# Patient Record
Sex: Female | Born: 1975 | Race: Black or African American | Hispanic: No | Marital: Single | Smoking: Current every day smoker
Health system: Southern US, Community
[De-identification: ages and names within clinical notes are randomized; demographics above are authoritative.]

---

## 2000-10-20 ENCOUNTER — Other Ambulatory Visit: Admission: RE | Admit: 2000-10-20 | Discharge: 2000-10-20 | Payer: Self-pay | Admitting: Obstetrics and Gynecology

## 2000-10-23 ENCOUNTER — Emergency Department (HOSPITAL_COMMUNITY): Admission: EM | Admit: 2000-10-23 | Discharge: 2000-10-23 | Payer: Self-pay | Admitting: Emergency Medicine

## 2002-09-01 ENCOUNTER — Emergency Department (HOSPITAL_COMMUNITY): Admission: EM | Admit: 2002-09-01 | Discharge: 2002-09-01 | Payer: Self-pay

## 2003-01-01 ENCOUNTER — Emergency Department (HOSPITAL_COMMUNITY): Admission: EM | Admit: 2003-01-01 | Discharge: 2003-01-01 | Payer: Self-pay | Admitting: Internal Medicine

## 2004-04-11 ENCOUNTER — Observation Stay (HOSPITAL_COMMUNITY): Admission: EM | Admit: 2004-04-11 | Discharge: 2004-04-12 | Payer: Self-pay | Admitting: Emergency Medicine

## 2005-11-28 ENCOUNTER — Emergency Department: Payer: Self-pay | Admitting: Emergency Medicine

## 2009-01-01 ENCOUNTER — Emergency Department (HOSPITAL_COMMUNITY): Admission: EM | Admit: 2009-01-01 | Discharge: 2009-01-01 | Payer: Self-pay | Admitting: Emergency Medicine

## 2009-01-03 ENCOUNTER — Emergency Department (HOSPITAL_COMMUNITY): Admission: EM | Admit: 2009-01-03 | Discharge: 2009-01-03 | Payer: Self-pay | Admitting: Emergency Medicine

## 2010-04-27 LAB — DIFFERENTIAL
Basophils Absolute: 0 10*3/uL (ref 0.0–0.1)
Basophils Relative: 0 % (ref 0–1)
Eosinophils Relative: 1 % (ref 0–5)
Monocytes Absolute: 0.8 10*3/uL (ref 0.1–1.0)
Monocytes Relative: 8 % (ref 3–12)
Neutro Abs: 6.9 10*3/uL (ref 1.7–7.7)
Neutrophils Relative %: 70 % (ref 43–77)

## 2010-04-27 LAB — URINALYSIS, ROUTINE W REFLEX MICROSCOPIC
Bilirubin Urine: NEGATIVE
Leukocytes, UA: NEGATIVE
Nitrite: NEGATIVE
Specific Gravity, Urine: 1.025 (ref 1.005–1.030)
pH: 6 (ref 5.0–8.0)

## 2010-04-27 LAB — CBC
HCT: 45.8 % (ref 36.0–46.0)
Hemoglobin: 15.1 g/dL — ABNORMAL HIGH (ref 12.0–15.0)
MCHC: 32.9 g/dL (ref 30.0–36.0)
RBC: 4.83 MIL/uL (ref 3.87–5.11)

## 2010-04-27 LAB — BASIC METABOLIC PANEL
Chloride: 102 mEq/L (ref 96–112)
GFR calc Af Amer: >60 mL/min (ref >60)
GFR calc non Af Amer: >60 mL/min (ref >60)
Potassium: 3.6 mEq/L (ref 3.5–5.1)
Sodium: 137 mEq/L (ref 135–145)

## 2010-04-27 LAB — URINE MICROSCOPIC-ADD ON

## 2010-06-11 NOTE — Op Note (Signed)
NAME:  Kara May, EBANKS NO.:  [id number]   MEDICAL RECORD NO.:  [id number]          PATIENT TYPE:  INP   LOCATION:  A310                          FACILITY:  [facility]   PHYSICIAN:  Dalia Heading, M.D.  DATE OF BIRTH:  November 02, 1975   DATE OF PROCEDURE:  04/12/2004  DATE OF DISCHARGE:                                 OPERATIVE REPORT   PREOPERATIVE DIAGNOSIS:  Incarcerated umbilical hernia.   POSTOPERATIVE DIAGNOSIS:  Incarcerated umbilical hernia.   OPERATION/PROCEDURE:  Umbilical herniorrhaphy.   SURGEON:  Dalia Heading, M.D.   ANESTHESIA:  General endotracheal.   INDICATIONS:  The patient is a 35 year old black female who presents with an  incarcerated umbilical hernia.  The risks and benefits of the procedure  including bleeding, infection, and recurrence of the hernia were fully  explained to the patient, who gave informed consent.   DESCRIPTION OF PROCEDURE:  The patient was placed in the supine position.  After induction of general endotracheal anesthesia, the abdomen was prepped  and draped using the usual sterile technique with Betadine.  Surgical site  confirmation was performed.   An infraumbilical incision was made down to the fascia.  The umbilicus was  freed away from the underlying fascia.  The patient was noted to have  incarcerated omentum in her hernia.  This was excised and tied off using a 2-  0 Vicryl tie.  The omentum was then reduced into the abdominal cavity.  The  defect was then closed using 0 Surgitek interrupted sutures x2.  The base of  the umbilicus was secured back to the fascia using a 2-0 Vicryl interrupted  suture.  The subcutaneous layers were reapproximated using a 3-0 Vicryl  interrupted suture.  The skin was closed using a 4-0 Vicryl subcuticular  suture.  Sensorcaine 0.5% was instilled into the surrounding wound.  The  wound was covered with Dermabond.   All tape and needle counts were correct at the end of the procedure.   The  patient was extubated in the operating room and went back to the recovery  room awake, in stable condition.  Complications:  None.  Specimen:  None.  Blood loss minimal.    MAJ/MEDQ  D:  04/12/2004  T:  04/12/2004  Job:  [id number]

## 2010-06-11 NOTE — H&P (Signed)
NAME:  Kara May, Kara May NO.:  [id number]   MEDICAL RECORD NO.:  [id number]          PATIENT TYPE:  EMS   LOCATION:  ED                            FACILITY:  [facility]   PHYSICIAN:  Dalia Heading, M.D.  DATE OF BIRTH:  04/13/1975   DATE OF ADMISSION:  04/11/2004  DATE OF DISCHARGE:  LH                                HISTORY & PHYSICAL   REASON FOR ADMISSION:  Incarcerated umbilical hernia.   HISTORY OF PRESENT ILLNESS:  The patient is a 34 year old black female who  has had a known history of swelling in the umbilical region, status post  tubal ligation in the past, who presents to the emergency room with  worsening swelling in the umbilical region.  The umbilical hernia can be  partially reduced, though it does not fully reduce.  There appears to be  omentum within the umbilical hernia.  Her abdomen is soft and flat. She has  not had any nausea or vomiting, though her appetite was decreased this  morning.   PAST MEDICAL HISTORY:  As noted above.   PAST SURGICAL HISTORY:  As noted above.   CURRENT MEDICATIONS:  None.   ALLERGIES:  No known drug allergies.   REVIEW OF SYSTEMS:  Noncontributory.   PHYSICAL EXAMINATION:  GENERAL:  The patient is a well-developed, well-  nourished black female, in no acute distress.  VITAL SIGNS:  She is afebrile and vital signs are stable.  LUNGS:  Clear to auscultation with equal breath sounds bilaterally.  HEART:  Examination reveals a regular rate and rhythm without S3, S4 or  murmurs.  ABDOMEN:  The abdomen is soft and flat.  No hepatosplenomegaly or masses are  noted.  As stated earlier, an umbilical hernia is present.   IMPRESSION:  Incarcerated umbilical hernia.   PLAN:  The patient will be admitted to the hospital for pain control.  She  subsequently will undergo an umbilical herniorrhaphy.  The risks and  benefits of the procedure will be fully explained to the patient.      MAJ/MEDQ  D:  04/11/2004  T:   04/11/2004  Job:  [id number]

## 2015-01-24 ENCOUNTER — Emergency Department: Payer: Self-pay

## 2015-01-24 ENCOUNTER — Emergency Department
Admission: EM | Admit: 2015-01-24 | Discharge: 2015-01-24 | Disposition: A | Discharge status: Home / Self Care | Payer: Self-pay | Attending: Emergency Medicine | Admitting: Emergency Medicine

## 2015-01-24 ENCOUNTER — Encounter: Payer: Self-pay | Admitting: Emergency Medicine

## 2015-01-24 DIAGNOSIS — S058X1A Other injuries of right eye and orbit, initial encounter: Secondary | ICD-10-CM

## 2015-01-24 DIAGNOSIS — Y9289 Other specified places as the place of occurrence of the external cause: Secondary | ICD-10-CM | POA: Insufficient documentation

## 2015-01-24 DIAGNOSIS — S0501XA Injury of conjunctiva and corneal abrasion without foreign body, right eye, initial encounter: Secondary | ICD-10-CM | POA: Insufficient documentation

## 2015-01-24 DIAGNOSIS — R55 Syncope and collapse: Secondary | ICD-10-CM | POA: Insufficient documentation

## 2015-01-24 DIAGNOSIS — F17200 Nicotine dependence, unspecified, uncomplicated: Secondary | ICD-10-CM | POA: Insufficient documentation

## 2015-01-24 DIAGNOSIS — N898 Other specified noninflammatory disorders of vagina: Secondary | ICD-10-CM | POA: Insufficient documentation

## 2015-01-24 DIAGNOSIS — S20211A Contusion of right front wall of thorax, initial encounter: Secondary | ICD-10-CM | POA: Insufficient documentation

## 2015-01-24 DIAGNOSIS — Y9389 Activity, other specified: Secondary | ICD-10-CM | POA: Insufficient documentation

## 2015-01-24 DIAGNOSIS — Y998 Other external cause status: Secondary | ICD-10-CM | POA: Insufficient documentation

## 2015-01-24 LAB — CBC WITH DIFFERENTIAL/PLATELET
BASOS PCT: 1 %
Basophils Absolute: 0 10*3/uL (ref 0–0.1)
Eosinophils Absolute: 0.1 10*3/uL (ref 0–0.7)
Eosinophils Relative: 1 %
HEMATOCRIT: 40.8 % (ref 35.0–47.0)
Hemoglobin: 13.4 g/dL (ref 12.0–16.0)
LYMPHS ABS: 1.5 10*3/uL (ref 1.0–3.6)
Lymphocytes Relative: 19 %
MCH: 30.1 pg (ref 26.0–34.0)
MCHC: 32.8 g/dL (ref 32.0–36.0)
MCV: 91.6 fL (ref 80.0–100.0)
MONO ABS: 1 10*3/uL — AB (ref 0.2–0.9)
MONOS PCT: 13 %
NEUTROS ABS: 5 10*3/uL (ref 1.4–6.5)
Neutrophils Relative %: 66 %
Platelets: 333 10*3/uL (ref 150–440)
RBC: 4.46 MIL/uL (ref 3.80–5.20)
RDW: 13 % (ref 11.5–14.5)
WBC: 7.6 10*3/uL (ref 3.6–11.0)

## 2015-01-24 LAB — COMPREHENSIVE METABOLIC PANEL
ALBUMIN: 4.1 g/dL (ref 3.5–5.0)
ALK PHOS: 57 U/L (ref 38–126)
ALT: 21 U/L (ref 14–54)
AST: 30 U/L (ref 15–41)
Anion gap: 8 (ref 5–15)
BUN: 9 mg/dL (ref 6–20)
CALCIUM: 9.3 mg/dL (ref 8.9–10.3)
CO2: 27 mmol/L (ref 22–32)
CREATININE: 0.77 mg/dL (ref 0.44–1.00)
Chloride: 102 mmol/L (ref 101–111)
GFR calc Af Amer: >60 mL/min (ref >60)
GFR calc non Af Amer: >60 mL/min (ref >60)
GLUCOSE: 114 mg/dL — AB (ref 65–99)
Potassium: 3.6 mmol/L (ref 3.5–5.1)
SODIUM: 137 mmol/L (ref 135–145)
Total Bilirubin: 1.3 mg/dL — ABNORMAL HIGH (ref 0.3–1.2)
Total Protein: 7.6 g/dL (ref 6.5–8.1)

## 2015-01-24 LAB — WET PREP, GENITAL
Clue Cells Wet Prep HPF POC: NONE SEEN
Sperm: NONE SEEN
TRICH WET PREP: NONE SEEN
Yeast Wet Prep HPF POC: NONE SEEN

## 2015-01-24 LAB — CHLAMYDIA/NGC RT PCR (ARMC ONLY)
CHLAMYDIA TR: NOT DETECTED
N GONORRHOEAE: NOT DETECTED

## 2015-01-24 MED ORDER — FLUORESCEIN SODIUM 1 MG OP STRP
1.0000 | ORAL_STRIP | Freq: Once | OPHTHALMIC | Status: AC
  Administered 2015-01-24: 1 via OPHTHALMIC

## 2015-01-24 MED ORDER — FLUORESCEIN SODIUM 1 MG OP STRP
ORAL_STRIP | OPHTHALMIC | Status: AC
Start: 2015-01-24 — End: 2015-01-24
  Administered 2015-01-24: 1 via OPHTHALMIC
  Filled 2015-01-24: qty 1

## 2015-01-24 MED ORDER — ERYTHROMYCIN 5 MG/GM OP OINT
TOPICAL_OINTMENT | Freq: Once | OPHTHALMIC | Status: AC
  Administered 2015-01-24: 1 via OPHTHALMIC
  Filled 2015-01-24: qty 1

## 2015-01-24 MED ORDER — ERYTHROMYCIN 5 MG/GM OP OINT: 1.0000 "application " | TOPICAL_OINTMENT | Freq: Four times a day (QID) | OPHTHALMIC | Status: AC

## 2015-01-24 MED ORDER — TETRACAINE HCL 0.5 % OP SOLN
2.0000 [drp] | Freq: Once | OPHTHALMIC | Status: AC
  Administered 2015-01-24: 2 [drp] via OPHTHALMIC
  Filled 2015-01-24: qty 2
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
## 2015-01-24 NOTE — ED Notes (Signed)
Pt. Refused to have pregnancy test done since she's had her tubes tied for the last 15years

## 2015-01-24 NOTE — ED Notes (Signed)
MD Lord at bedside. 
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
## 2015-01-24 NOTE — Discharge Instructions (Signed)
You were evaluated after episode passing out, and your exam and evaluation are reassuring. We also evaluated you after the traumatic injuries, and your CAT scan is normal. Your eye was found to have an abrasion on the sclera. You also have bruising to the right side of the rib/chest wall.  Take over-the-counter ibuprofen as needed, 600 mg every 8 hours as needed for pain.  Return to the emergency room for any worsening condition including additional episode passing out, weakness, numbness, confusion altered mental status, chest pain or trouble breathing, vision problems including blurry vision or double vision, or any symptoms concerning to you.   Chest Contusion A chest contusion is a deep bruise on your chest area. Contusions are the result of an injury that caused bleeding under the skin. A chest contusion may involve bruising of the skin, muscles, or ribs. The contusion may turn blue, purple, or yellow. Minor injuries will give you a painless contusion, but more severe contusions may stay painful and swollen for a few weeks. CAUSES  A contusion is usually caused by a blow, trauma, or direct force to an area of the body. SYMPTOMS   Swelling and redness of the injured area.  Discoloration of the injured area.  Tenderness and soreness of the injured area.  Pain. DIAGNOSIS  The diagnosis can be made by taking a history and performing a physical exam. An X-ray, CT scan, or MRI may be needed to determine if there were any associated injuries, such as broken bones (fractures) or internal injuries. TREATMENT  Often, the best treatment for a chest contusion is resting, icing, and applying cold compresses to the injured area. Deep breathing exercises may be recommended to reduce the risk of pneumonia. Over-the-counter medicines may also be recommended for pain control. HOME CARE INSTRUCTIONS   Put ice on the injured area.  Put ice in a plastic bag.  Place a towel between your skin and the  bag.  Leave the ice on for 15-20 minutes, 03-04 times a day.  Only take over-the-counter or prescription medicines as directed by your caregiver. Your caregiver may recommend avoiding anti-inflammatory medicines (aspirin, ibuprofen, and naproxen) for 48 hours because these medicines may increase bruising.  Rest the injured area.  Perform deep-breathing exercises as directed by your caregiver.  Stop smoking if you smoke.  Do not lift objects over 5 pounds (2.3 kg) for 3 days or longer if recommended by your caregiver. SEEK IMMEDIATE MEDICAL CARE IF:   You have increased bruising or swelling.  You have pain that is getting worse.  You have difficulty breathing.  You have dizziness, weakness, or fainting.  You have blood in your urine or stool.  You cough up or vomit blood.  Your swelling or pain is not relieved with medicines. MAKE SURE YOU:   Understand these instructions.  Will watch your condition.  Will get help right away if you are not doing well or get worse.   This information is not intended to replace advice given to you by your health care provider. Make sure you discuss any questions you have with your health care provider.   Document Released: 10/05/2000 Document Revised: 10/05/2011 Document Reviewed: 07/04/2011 Elsevier Interactive Patient Education 2016 [employer].  Syncope Syncope is a medical term for fainting or passing out. This means you lose consciousness and drop to the ground. People are generally unconscious for less than 5 minutes. You may have some muscle twitches for up to 15 seconds before waking up and returning  to normal. Syncope occurs more often in older adults, but it can happen to anyone. While most causes of syncope are not dangerous, syncope can be a sign of a serious medical problem. It is important to seek medical care.  CAUSES  Syncope is caused by a sudden drop in blood flow to the brain. The specific cause is often not determined.  Factors that can bring on syncope include:  Taking medicines that lower blood pressure.  Sudden changes in posture, such as standing up quickly.  Taking more medicine than prescribed.  Standing in one place for too long.  Seizure disorders.  Dehydration and excessive exposure to heat.  Low blood sugar (hypoglycemia).  Straining to have a bowel movement.  Heart disease, irregular heartbeat, or other circulatory problems.  Fear, emotional distress, seeing blood, or severe pain. SYMPTOMS  Right before fainting, you may:  Feel dizzy or light-headed.  Feel nauseous.  See all white or all black in your field of vision.  Have cold, clammy skin. DIAGNOSIS  Your health care provider will ask about your symptoms, perform a physical exam, and perform an electrocardiogram (ECG) to record the electrical activity of your heart. Your health care provider may also perform other heart or blood tests to determine the cause of your syncope which may include:  Transthoracic echocardiogram (TTE). During echocardiography, sound waves are used to evaluate how blood flows through your heart.  Transesophageal echocardiogram (TEE).  Cardiac monitoring. This allows your health care provider to monitor your heart rate and rhythm in real time.  Holter monitor. This is a portable device that records your heartbeat and can help diagnose heart arrhythmias. It allows your health care provider to track your heart activity for several days, if needed.  Stress tests by exercise or by giving medicine that makes the heart beat faster. TREATMENT  In most cases, no treatment is needed. Depending on the cause of your syncope, your health care provider may recommend changing or stopping some of your medicines. HOME CARE INSTRUCTIONS  Have someone stay with you until you feel stable.  Do not drive, use machinery, or play sports until your health care provider says it is okay.  Keep all follow-up appointments  as directed by your health care provider.  Lie down right away if you start feeling like you might faint. Breathe deeply and steadily. Wait until all the symptoms have passed.  Drink enough fluids to keep your urine clear or pale yellow.  If you are taking blood pressure or heart medicine, get up slowly and take several minutes to sit and then stand. This can reduce dizziness. SEEK IMMEDIATE MEDICAL CARE IF:   You have a severe headache.  You have unusual pain in the chest, abdomen, or back.  You are bleeding from your mouth or rectum, or you have black or tarry stool.  You have an irregular or very fast heartbeat.  You have pain with breathing.  You have repeated fainting or seizure-like jerking during an episode.  You faint when sitting or lying down.  You have confusion.  You have trouble walking.  You have severe weakness.  You have vision problems. If you fainted, call your local emergency services (911 in [place] Do not drive yourself to the hospital.    This information is not intended to replace advice given to you by your health care provider. Make sure you discuss any questions you have with your health care provider.   Document Released: 01/10/2005 Document Revised: 05/27/2014 Document  Reviewed: 03/11/2011 Elsevier Interactive Patient Education [employer]
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
